# Patient Record
Sex: Female | Born: 1960 | Race: White | Hispanic: No | Smoking: Never smoker
Health system: Southern US, Community
[De-identification: ages and names within clinical notes are randomized; demographics above are authoritative.]

## PROBLEM LIST (undated history)

## (undated) DIAGNOSIS — Z973 Presence of spectacles and contact lenses: Secondary | ICD-10-CM

## (undated) DIAGNOSIS — E785 Hyperlipidemia, unspecified: Secondary | ICD-10-CM

## (undated) DIAGNOSIS — M84376A Stress fracture, unspecified foot, initial encounter for fracture: Secondary | ICD-10-CM

## (undated) DIAGNOSIS — Z87410 Personal history of cervical dysplasia: Secondary | ICD-10-CM

## (undated) DIAGNOSIS — I10 Essential (primary) hypertension: Secondary | ICD-10-CM

## (undated) DIAGNOSIS — E7800 Pure hypercholesterolemia, unspecified: Secondary | ICD-10-CM

## (undated) DIAGNOSIS — N871 Moderate cervical dysplasia: Secondary | ICD-10-CM

## (undated) DIAGNOSIS — M8580 Other specified disorders of bone density and structure, unspecified site: Secondary | ICD-10-CM

## (undated) HISTORY — PX: NO PAST SURGERIES: SHX2092

## (undated) HISTORY — PX: COLONOSCOPY: SHX174

---

## 1997-08-07 ENCOUNTER — Other Ambulatory Visit: Admission: RE | Admit: 1997-08-07 | Discharge: 1997-08-07 | Payer: Self-pay | Admitting: Obstetrics and Gynecology

## 1997-09-18 ENCOUNTER — Inpatient Hospital Stay (HOSPITAL_COMMUNITY): Admission: AD | Admit: 1997-09-18 | Discharge: 1997-09-20 | Payer: Self-pay | Admitting: Obstetrics & Gynecology

## 1997-09-21 ENCOUNTER — Encounter: Admission: RE | Admit: 1997-09-21 | Discharge: 1997-12-20 | Payer: Self-pay | Admitting: Obstetrics & Gynecology

## 1997-10-23 ENCOUNTER — Other Ambulatory Visit: Admission: RE | Admit: 1997-10-23 | Discharge: 1997-10-23 | Payer: Self-pay | Admitting: Obstetrics and Gynecology

## 1998-01-31 ENCOUNTER — Encounter (HOSPITAL_COMMUNITY): Admission: RE | Admit: 1998-01-31 | Discharge: 1998-05-01 | Payer: Self-pay | Admitting: *Deleted

## 1998-10-28 ENCOUNTER — Other Ambulatory Visit: Admission: RE | Admit: 1998-10-28 | Discharge: 1998-10-28 | Payer: Self-pay | Admitting: Obstetrics and Gynecology

## 1999-12-09 ENCOUNTER — Other Ambulatory Visit: Admission: RE | Admit: 1999-12-09 | Discharge: 1999-12-09 | Payer: Self-pay | Admitting: Obstetrics and Gynecology

## 2001-01-13 ENCOUNTER — Other Ambulatory Visit: Admission: RE | Admit: 2001-01-13 | Discharge: 2001-01-13 | Payer: Self-pay | Admitting: Obstetrics and Gynecology

## 2002-04-03 ENCOUNTER — Other Ambulatory Visit: Admission: RE | Admit: 2002-04-03 | Discharge: 2002-04-03 | Payer: Self-pay | Admitting: Obstetrics and Gynecology

## 2003-04-23 ENCOUNTER — Other Ambulatory Visit: Admission: RE | Admit: 2003-04-23 | Discharge: 2003-04-23 | Payer: Self-pay | Admitting: Obstetrics and Gynecology

## 2004-04-16 ENCOUNTER — Other Ambulatory Visit: Admission: RE | Admit: 2004-04-16 | Discharge: 2004-04-16 | Payer: Self-pay | Admitting: Obstetrics and Gynecology

## 2005-04-21 ENCOUNTER — Other Ambulatory Visit: Admission: RE | Admit: 2005-04-21 | Discharge: 2005-04-21 | Payer: Self-pay | Admitting: Obstetrics and Gynecology

## 2010-05-04 HISTORY — PX: HYSTEROSCOPY W/ ENDOMETRIAL ABLATION: SUR665

## 2016-10-28 ENCOUNTER — Other Ambulatory Visit: Payer: Self-pay | Admitting: Obstetrics and Gynecology

## 2016-10-28 DIAGNOSIS — R928 Other abnormal and inconclusive findings on diagnostic imaging of breast: Secondary | ICD-10-CM

## 2016-11-10 ENCOUNTER — Ambulatory Visit
Admission: RE | Admit: 2016-11-10 | Discharge: 2016-11-10 | Disposition: A | Discharge status: Home / Self Care | Payer: BLUE CROSS/BLUE SHIELD | Source: Ambulatory Visit | Attending: Obstetrics and Gynecology | Admitting: Obstetrics and Gynecology

## 2016-11-10 ENCOUNTER — Ambulatory Visit: Payer: Self-pay

## 2016-11-10 DIAGNOSIS — R928 Other abnormal and inconclusive findings on diagnostic imaging of breast: Secondary | ICD-10-CM

## 2018-04-09 IMAGING — MG 2D DIGITAL DIAGNOSTIC UNILATERAL LEFT MAMMOGRAM WITH CAD AND ADJ
9 series · 9 of 21 positions shown · non-contrast
Comparison: Previous exam(s).

CLINICAL DATA: The patient was called back for possible distortion
in the left breast.

EXAM:
2D DIGITAL DIAGNOSTIC UNILATERAL LEFT MAMMOGRAM WITH CAD AND ADJUNCT
TOMO

[L ML]
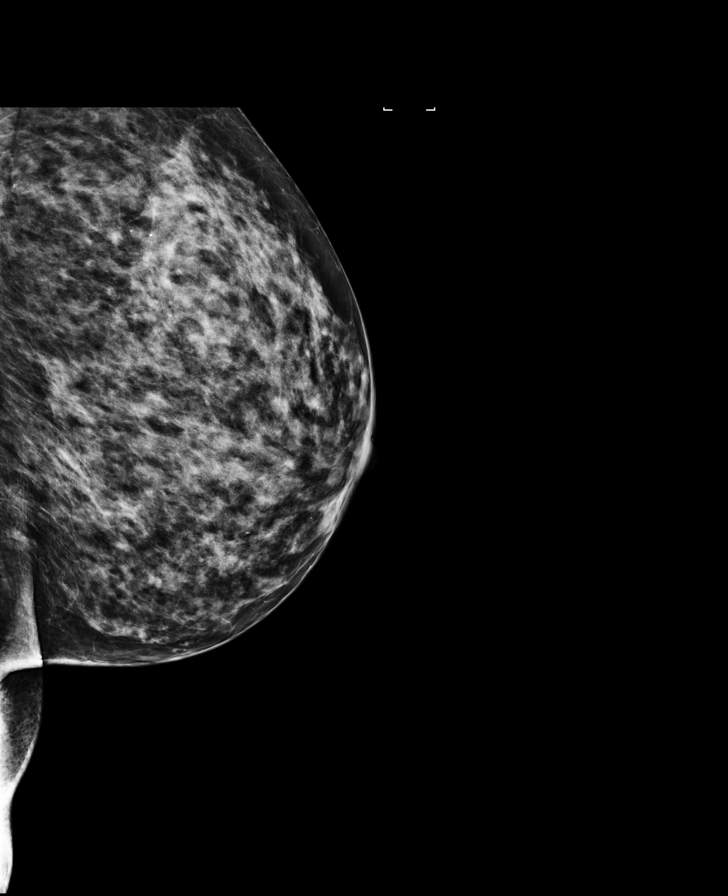

[L CC synth-2D (1 of 2)]
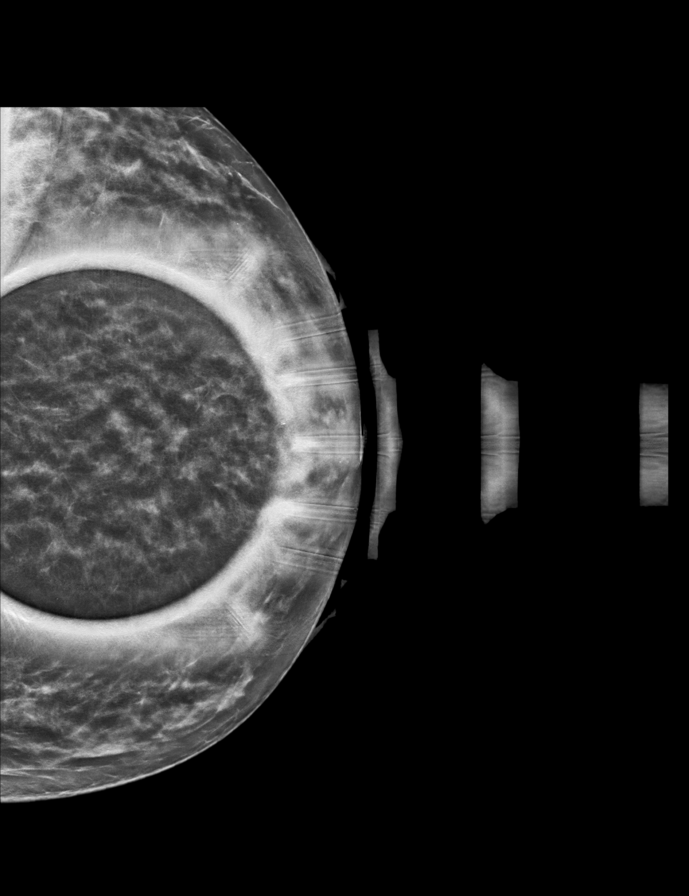

[L ML synth-2D]
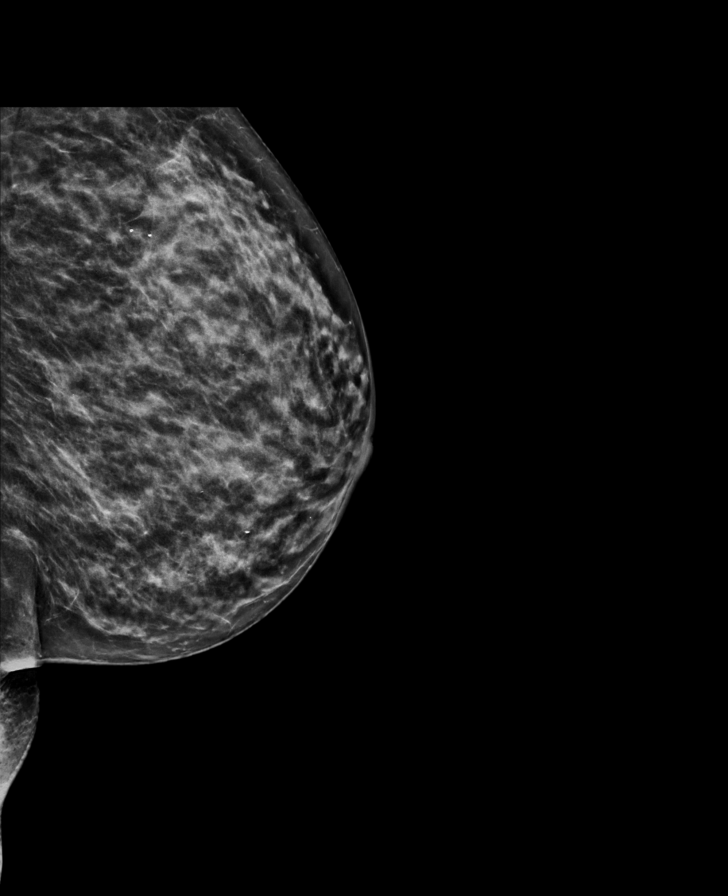

[L CC synth-2D (2 of 2)]
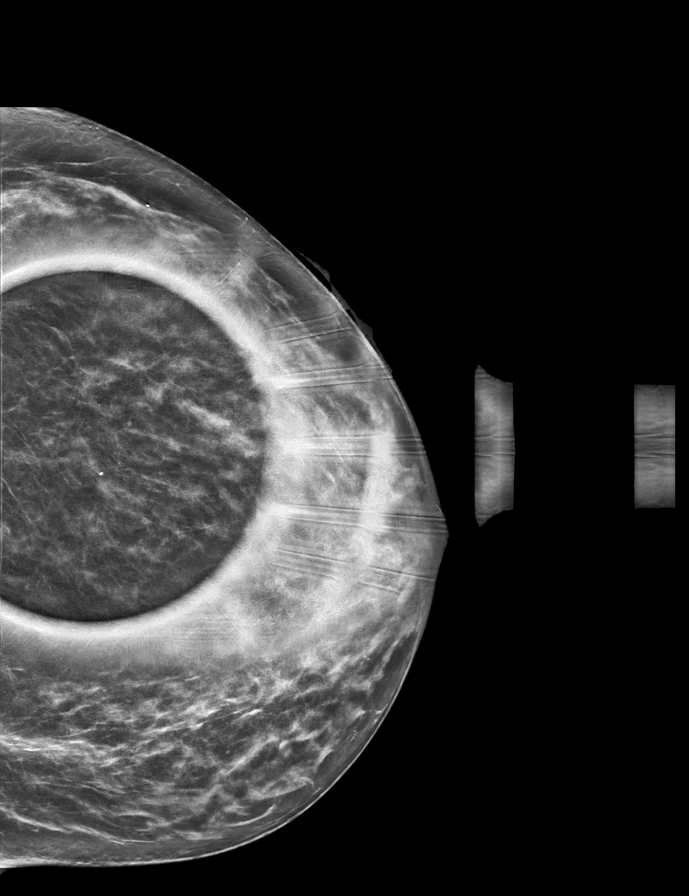

[L CC (1 of 2)]
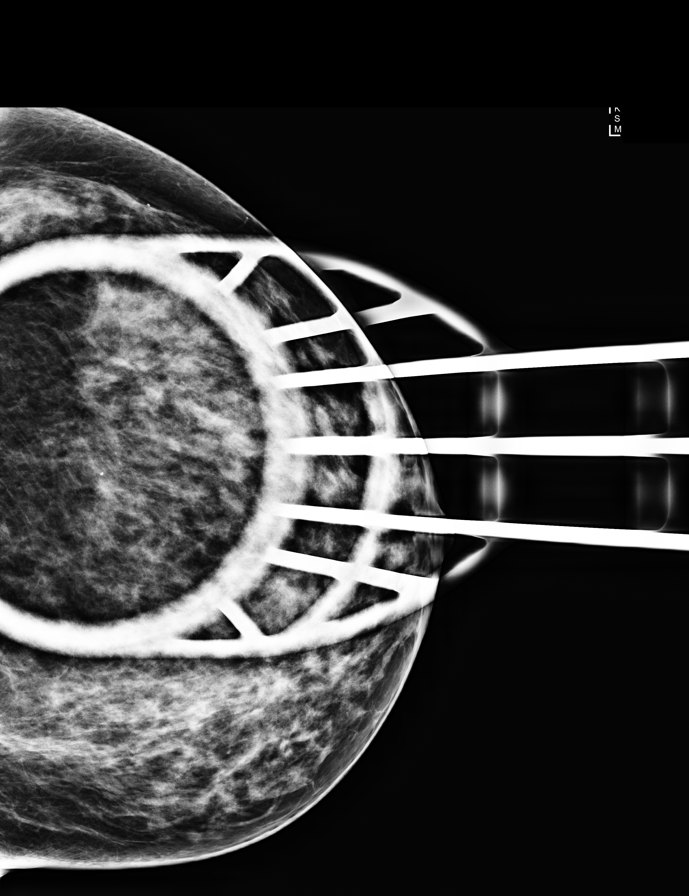

[L CC (2 of 2)]
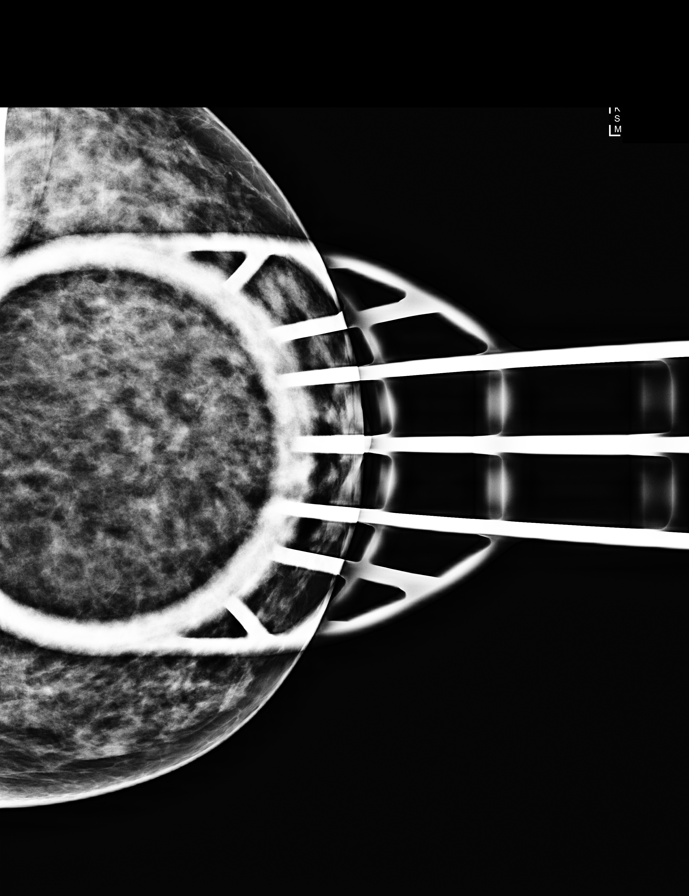

[L ML tomo · tomo slice 31/62.0]
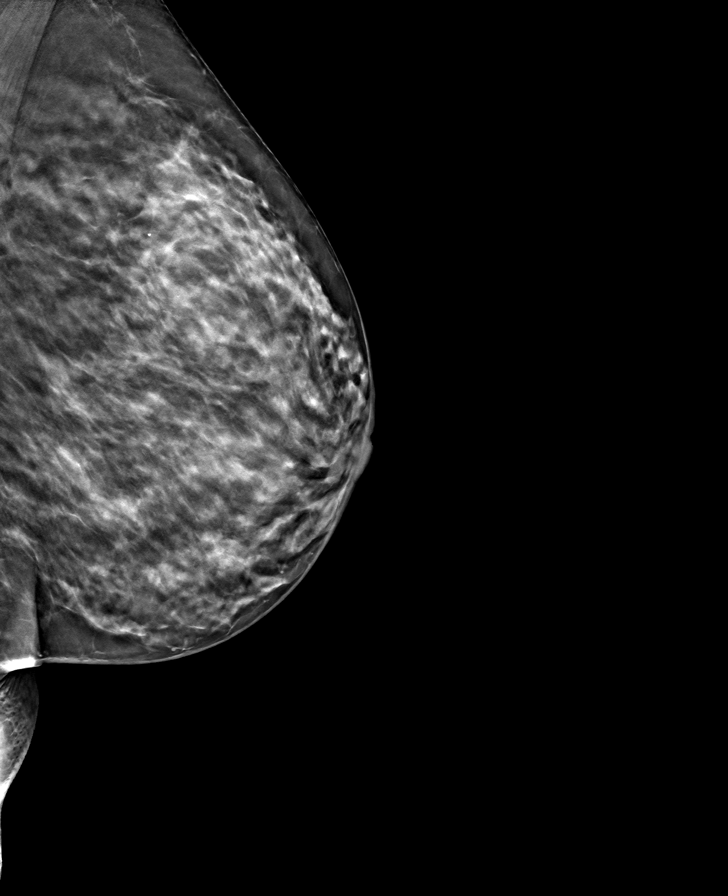

[L CC tomo (1 of 2) · tomo slice 29/58.0]
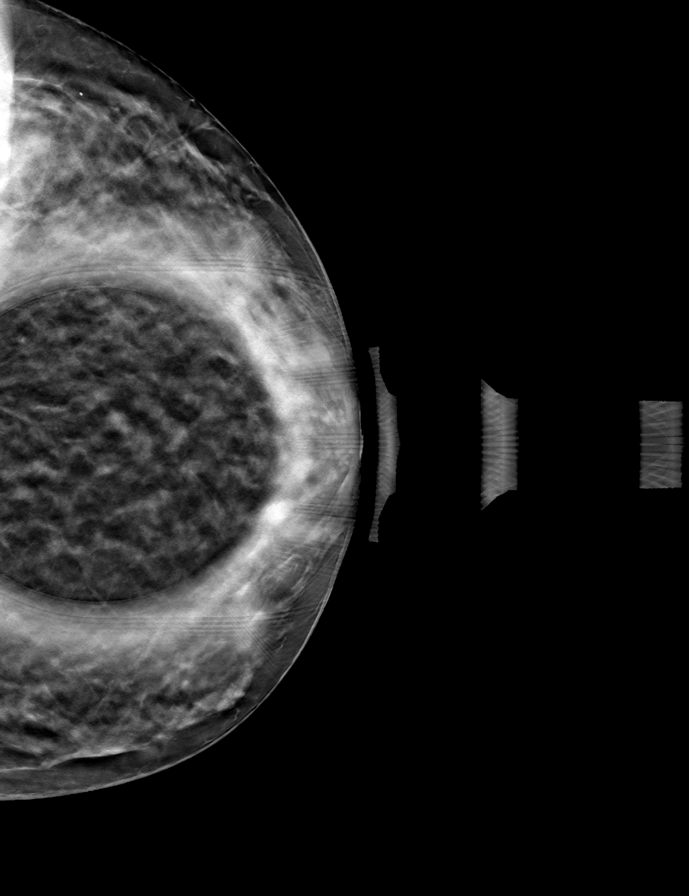

[L CC tomo (2 of 2) · tomo slice 30/59.0]
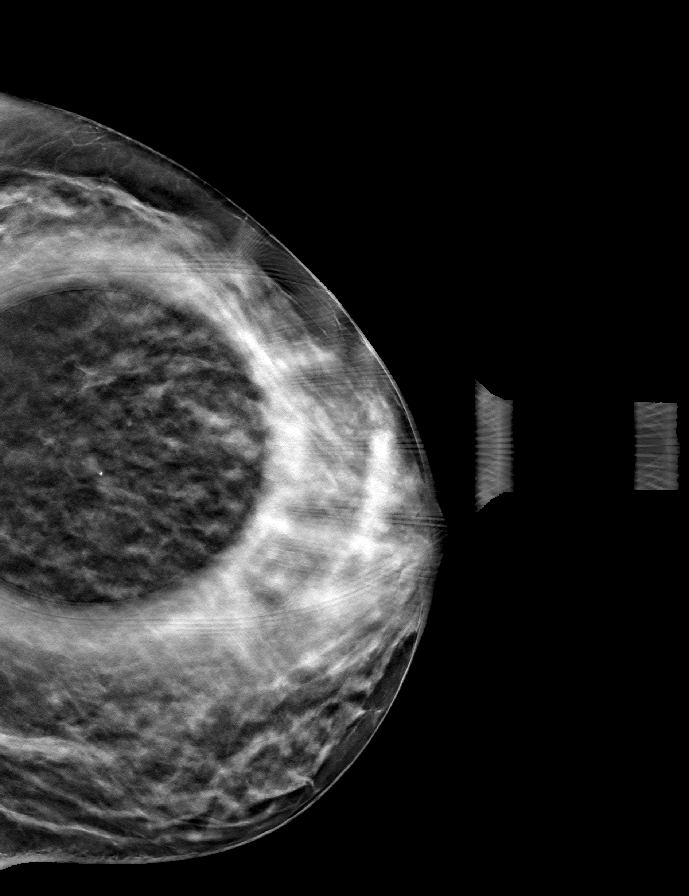

[9 of 21 positions shown; findings below may reference images not displayed]

ACR Breast Density Category c: The breast tissue is heterogeneously
dense, which may obscure small masses.
FINDINGS: The possible distortion resolves on additional imaging.

Mammographic images were processed with CAD.
IMPRESSION: No mammographic evidence of malignancy.

RECOMMENDATION:
Annual screening mammography.

I have discussed the findings and recommendations with the patient.
Results were also provided in writing at the conclusion of the
visit. If applicable, a reminder letter will be sent to the patient
regarding the next appointment.

BI-RADS CATEGORY  2: Benign.

## 2020-12-08 ENCOUNTER — Encounter (HOSPITAL_BASED_OUTPATIENT_CLINIC_OR_DEPARTMENT_OTHER): Payer: Self-pay | Admitting: Emergency Medicine

## 2020-12-08 ENCOUNTER — Other Ambulatory Visit: Payer: Self-pay

## 2020-12-08 ENCOUNTER — Emergency Department (HOSPITAL_BASED_OUTPATIENT_CLINIC_OR_DEPARTMENT_OTHER)
Admission: EM | Admit: 2020-12-08 | Discharge: 2020-12-08 | Disposition: A | Discharge status: Home / Self Care | Payer: BC Managed Care – PPO | Attending: Emergency Medicine | Admitting: Emergency Medicine

## 2020-12-08 ENCOUNTER — Emergency Department (HOSPITAL_BASED_OUTPATIENT_CLINIC_OR_DEPARTMENT_OTHER): Payer: BC Managed Care – PPO

## 2020-12-08 DIAGNOSIS — M79671 Pain in right foot: Secondary | ICD-10-CM | POA: Diagnosis not present

## 2020-12-08 DIAGNOSIS — W208XXA Other cause of strike by thrown, projected or falling object, initial encounter: Secondary | ICD-10-CM | POA: Insufficient documentation

## 2020-12-08 DIAGNOSIS — I10 Essential (primary) hypertension: Secondary | ICD-10-CM | POA: Diagnosis not present

## 2020-12-08 HISTORY — DX: Essential (primary) hypertension: I10

## 2020-12-08 HISTORY — DX: Pure hypercholesterolemia, unspecified: E78.00

## 2020-12-08 NOTE — ED Provider Notes (Signed)
[facility] EMERGENCY DEPARTMENT Provider Note   CSN: [id number] Arrival date & time: 12/08/20  1028     History Chief Complaint  Patient presents with   Foot Pain    Brittany Barr is a 60 y.o. female.  HPI  Patient presents with right foot pain x3 weeks.  Occurred gradually after moving boxes up and down stairs 2 weeks ago, improved with rest but started hurting again last night after playing tennis.  She felt like there is a throbbing dull pain over the top aspect of her foot.  Pain is intermittent, throbbing.  Worse with ambulation, has not tried any alleviating medications like Tylenol ibuprofen.  No trauma to the foot or injury that she can think of.  No previous surgeries to the foot, she is not having any numbness and tingling in her toes.  No color changes.  Past Medical History:  Diagnosis Date   High cholesterol    Hypertension     There are no problems to display for this patient.   Past Surgical History:  Procedure Laterality Date   ABLATION       OB History   No obstetric history on file.     No family history on file.  Social History   Tobacco Use   Smoking status: Never   Smokeless tobacco: Never  Vaping Use   Vaping Use: Never used  Substance Use Topics   Alcohol use: Yes    Alcohol/week: 1.0 standard drink    Types: 1 Glasses of wine per week   Drug use: Never    Home Medications Prior to Admission medications   Not on File    Allergies    Penicillins  Review of Systems   Review of Systems  Musculoskeletal:  Positive for arthralgias. Negative for gait problem.       Foot pain   Physical Exam Updated Vital Signs BP (!) 132/91 (BP Location: Right Arm)   Pulse 77   Temp 98.8 F (37.1 C) (Oral)   Resp 16   Ht 5\' 2"  (1.575 m)   Wt 58.1 kg   SpO2 98%   BMI 23.41 kg/m   Physical Exam Vitals and nursing note reviewed. Exam conducted with a chaperone present.  Constitutional:      General: She is not in acute  distress.    Appearance: Normal appearance.  HENT:     Head: Normocephalic and atraumatic.  Eyes:     General: No scleral icterus.    Extraocular Movements: Extraocular movements intact.     Pupils: Pupils are equal, round, and reactive to light.  Cardiovascular:     Comments: DP and PT are 2+ bilaterally Musculoskeletal:        General: Tenderness present. No swelling or deformity.     Comments: Range of motion is fully intact including flexion, extension, abduction and abduction.  There is no obvious swelling or skin color changes.  Cap refill is less than 2 in each toe.  Sensation is the same on both sides, she has some pain along the dorsal aspect above her third toe, but no bony tenderness or crepitus.  Skin:    Coloration: Skin is not jaundiced.  Neurological:     Mental Status: She is alert. Mental status is at baseline.     Coordination: Coordination normal.    ED Results / Procedures / Treatments   Labs (all labs ordered are listed, but only abnormal results are displayed) Labs Reviewed - No data to  display  EKG None  Radiology No results found.  Procedures Procedures   Medications Ordered in ED Medications - No data to display  ED Course  I have reviewed the triage vital signs and the nursing notes.  Pertinent labs & imaging results that were available during my care of the patient were reviewed by me and considered in my medical decision making (see chart for details).  Clinical Course as of 12/08/20 1209  Sun Dec 08, 2020  1207 No signs of fracture, since suspect musculoskeletal strain.  Advised patient to tell ibuprofen as needed, she voiced understanding.  Vitals are stable, she is ambulatory.  Not suspect any emergent pathology requiring further work-up at this time.  She is appropriate for discharge. [HS]    Clinical Course User Index [HS] Theron Arista, PA-C   MDM Rules/Calculators/A&P                           Patient vitals are stable, physical  exam unremarkable.  She is neurovascularly intact, no obvious trauma.  I suspect the pain is likely musculoskeletal inflammation from overuse, but will order an x-ray to make sure there is no fracture.    No fever, no systemic symptoms, do not suspect septic joint.  Please see ED course interpretation of work-up.  No signs of fracture, suspect musculoskeletal strain.  Patient appropriate for discharge at this time.  Final Clinical Impression(s) / ED Diagnoses Final diagnoses:  None    Rx / DC Orders ED Discharge Orders     None        Theron Arista, PA-C 12/08/20 1209    Rolan Bucco, MD 12/08/20 [phone]

## 2020-12-08 NOTE — ED Triage Notes (Signed)
Pt c/o right foot pain ongoing for 3 weeks when she was moving furniture with slides on. Pt reports symptoms improved until she played tennis yesterday and she stepped wrong last night.

## 2020-12-08 NOTE — Discharge Instructions (Addendum)
May take ibuprofen or Tylenol as needed for pain.  Suspect your pain is likely due to inflammation from overuse, this will improve on its own with time.  I suspect will probably be a few weeks before you feel back to normal, you do not need to abstain from normal activities but I would avoid exerting yourself significantly.  Follow-up with your primary care doctor in the next 2 weeks if there is not any improvement.  Please turn to the ED if things change or worsen.

## 2021-01-21 ENCOUNTER — Encounter (HOSPITAL_BASED_OUTPATIENT_CLINIC_OR_DEPARTMENT_OTHER): Payer: Self-pay | Admitting: Obstetrics and Gynecology

## 2021-01-21 ENCOUNTER — Other Ambulatory Visit: Payer: Self-pay

## 2021-01-21 NOTE — Progress Notes (Signed)
Spoke w/ via phone for pre-op interview--- Pt Lab needs dos----  no             Lab results------ pt getting lab work done 01-24-2021 CBCdiff, T&S, BMP, EKG COVID test -----patient states asymptomatic no test needed Arrive at ------- 1315 on 01-28-2021 NPO after MN NO Solid Food.  Clear liquids from MN until--- 1215 Med rec completed Medications to take morning of surgery -----none Diabetic medication ----- n/a Patient instructed no nail polish to be worn day of surgery Patient instructed to bring photo id and insurance card day of surgery Patient aware to have Driver (ride ) / caregiver for 24 hours after surgery --friend, steve riley Patient Special Instructions ----- n/a Pre-Op special Istructions ----- n/a Patient verbalized understanding of instructions that were given at this phone interview. Patient denies shortness of breath, chest pain, fever, cough at this phone interview.

## 2021-01-24 ENCOUNTER — Other Ambulatory Visit: Payer: Self-pay

## 2021-01-24 ENCOUNTER — Encounter (HOSPITAL_COMMUNITY)
Admission: RE | Admit: 2021-01-24 | Discharge: 2021-01-24 | Disposition: A | Discharge status: Home / Self Care | Payer: BC Managed Care – PPO | Source: Ambulatory Visit | Attending: Obstetrics and Gynecology | Admitting: Obstetrics and Gynecology

## 2021-01-24 DIAGNOSIS — Z01812 Encounter for preprocedural laboratory examination: Secondary | ICD-10-CM | POA: Diagnosis present

## 2021-01-24 LAB — CBC WITH DIFFERENTIAL/PLATELET
Abs Immature Granulocytes: 0.02 10*3/uL (ref 0.00–0.07)
Basophils Absolute: 0.1 10*3/uL (ref 0.0–0.1)
Basophils Relative: 2 %
Eosinophils Absolute: 0.1 10*3/uL (ref 0.0–0.5)
Eosinophils Relative: 3 %
HCT: 41.2 % (ref 36.0–46.0)
Hemoglobin: 13.7 g/dL (ref 12.0–15.0)
Immature Granulocytes: 0 %
Lymphocytes Relative: 33 %
Lymphs Abs: 1.5 10*3/uL (ref 0.7–4.0)
MCH: 34.4 pg — ABNORMAL HIGH (ref 26.0–34.0)
MCHC: 33.3 g/dL (ref 30.0–36.0)
MCV: 103.5 fL — ABNORMAL HIGH (ref 80.0–100.0)
Monocytes Absolute: 0.5 10*3/uL (ref 0.1–1.0)
Monocytes Relative: 10 %
Neutro Abs: 2.5 10*3/uL (ref 1.7–7.7)
Neutrophils Relative %: 52 %
Platelets: 281 10*3/uL (ref 150–400)
RBC: 3.98 MIL/uL (ref 3.87–5.11)
RDW: 12.2 % (ref 11.5–15.5)
WBC: 4.7 10*3/uL (ref 4.0–10.5)
nRBC: 0 % (ref 0.0–0.2)

## 2021-01-24 LAB — BASIC METABOLIC PANEL
Anion gap: 9 (ref 5–15)
BUN: 11 mg/dL (ref 6–20)
CO2: 28 mmol/L (ref 22–32)
Calcium: 10.2 mg/dL (ref 8.9–10.3)
Chloride: 109 mmol/L (ref 98–111)
Creatinine, Ser: 0.61 mg/dL (ref 0.44–1.00)
GFR, Estimated: >60 mL/min (ref >60)
Glucose, Bld: 96 mg/dL (ref 70–99)
Potassium: 4.2 mmol/L (ref 3.5–5.1)
Sodium: 146 mmol/L — ABNORMAL HIGH (ref 135–145)

## 2021-01-27 NOTE — H&P (Signed)
Brittany Barr is an 60 y.o. 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   08/29/2012 pap NILM 09/21/2013 pap NILM 10/04/2014 pap NILM 10/24/2015 pap LSIL 11/12/2015 endocervix curettage at least CIN 2 12/05/2015 LEEP CIN 1, negative margins 10/27/2017 pap WNL 11/29/2017 pap LSIL 12/20/2017 colpo benign 03/28/2018 pap LSIL 03/22/2019 pap NILM 05/22/2020 pap LSIL 11/19/20 ASCUS HPV neg 12/10/20 colpo CIN 2 (6, 9, and 10 o'clock; ECC CIN 1)   No LMP recorded. Patient has had an ablation.    Past Medical History:  Diagnosis Date   CIN II (cervical intraepithelial neoplasia II)    Dyslipidemia    History of cervical dysplasia    Hypertension    followed by pcp and cardiologist-- dr t. Judithe Modest   Osteopenia    Stress fracture of foot    right foot, wearing boot   Wears contact lenses     Past Surgical History:  Procedure Laterality Date   COLONOSCOPY     last one 2017   HYSTEROSCOPY W/ ENDOMETRIAL ABLATION  2012   in gyn office   NO PAST SURGERIES      History reviewed. No pertinent family history.  Social History:  reports that she has never smoked. She has never used smokeless tobacco. She reports current alcohol use of about 7.0 standard drinks per week. She reports that she does not use drugs.  Allergies:  Allergies  Allergen Reactions   Thimerosal Swelling    "Makes eyes bubble"   Penicillins Rash    No medications prior to admission.    Review of Systems  Constitutional:  Negative for fever.  HENT:  Negative for sore throat.   Eyes:  Negative for pain.  Respiratory:  Negative for shortness of breath.   Cardiovascular:  Negative for chest pain.  Gastrointestinal:  Negative for abdominal pain.  Genitourinary:  Negative for pelvic pain and vaginal bleeding.  Musculoskeletal:  Negative for myalgias.  Skin:  Negative for rash.  Neurological:  Negative for headaches.  Psychiatric/Behavioral:  Negative for suicidal ideas.    Height 5\' 2"  (1.575  m), weight 57.6 kg. Physical Exam From 12/10/20 office exam  Chaperone Chaperone: present  Constitutional General Appearance: healthy-appearing, well-nourished, well-developed  Psychiatric Orientation: to time, to place, to person Mood and Affect: active and alert, normal mood, normal affect  Female Genitalia Vulva: no masses, no atrophy, no lesions Bladder/Urethra: normal meatus, no urethral discharge, no urethral mass, bladder non distended Vagina no tenderness, no erythema, no abnormal vaginal discharge, no vesicle(s) or ulcers, no cystocele, no rectocele (atrophy) Cervix: no discharge, no cervical motion tenderness (stenotic cervix)  No results found for this or any previous visit (from the past 24 hour(s)).  No results found.  Assessment/Plan: 59Y with CIN2 on colposcopy, plan for cold knife conization of cervix.   02/09/21 01/27/2021, 7:46 PM

## 2021-01-28 ENCOUNTER — Encounter (HOSPITAL_BASED_OUTPATIENT_CLINIC_OR_DEPARTMENT_OTHER)
Admission: RE | Disposition: A | Discharge status: Home / Self Care | Payer: Self-pay | Source: Ambulatory Visit | Attending: Obstetrics and Gynecology

## 2021-01-28 ENCOUNTER — Ambulatory Visit (HOSPITAL_BASED_OUTPATIENT_CLINIC_OR_DEPARTMENT_OTHER)
Admission: RE | Admit: 2021-01-28 | Discharge: 2021-01-28 | Disposition: A | Discharge status: Home / Self Care | Payer: BC Managed Care – PPO | Source: Ambulatory Visit | Attending: Obstetrics and Gynecology | Admitting: Obstetrics and Gynecology

## 2021-01-28 ENCOUNTER — Ambulatory Visit (HOSPITAL_BASED_OUTPATIENT_CLINIC_OR_DEPARTMENT_OTHER): Payer: BC Managed Care – PPO | Admitting: Certified Registered"

## 2021-01-28 ENCOUNTER — Encounter (HOSPITAL_BASED_OUTPATIENT_CLINIC_OR_DEPARTMENT_OTHER): Payer: Self-pay | Admitting: Obstetrics and Gynecology

## 2021-01-28 DIAGNOSIS — N871 Moderate cervical dysplasia: Secondary | ICD-10-CM | POA: Insufficient documentation

## 2021-01-28 DIAGNOSIS — I10 Essential (primary) hypertension: Secondary | ICD-10-CM | POA: Diagnosis not present

## 2021-01-28 DIAGNOSIS — M8580 Other specified disorders of bone density and structure, unspecified site: Secondary | ICD-10-CM | POA: Insufficient documentation

## 2021-01-28 HISTORY — DX: Personal history of cervical dysplasia: Z87.410

## 2021-01-28 HISTORY — PX: CERVICAL CONIZATION W/BX: SHX1330

## 2021-01-28 HISTORY — DX: Presence of spectacles and contact lenses: Z97.3

## 2021-01-28 HISTORY — DX: Stress fracture, unspecified foot, initial encounter for fracture: M84.376A

## 2021-01-28 HISTORY — DX: Other specified disorders of bone density and structure, unspecified site: M85.80

## 2021-01-28 HISTORY — DX: Hyperlipidemia, unspecified: E78.5

## 2021-01-28 HISTORY — DX: Moderate cervical dysplasia: N87.1

## 2021-01-28 LAB — TYPE AND SCREEN
ABO/RH(D): O POS
Antibody Screen: NEGATIVE

## 2021-01-28 LAB — ABO/RH: ABO/RH(D): O POS

## 2021-01-28 SURGERY — CONE BIOPSY, CERVIX
Anesthesia: General | Site: Cervix

## 2021-01-28 MED ORDER — ONDANSETRON HCL 4 MG/2ML IJ SOLN
INTRAMUSCULAR | Status: AC
  Filled 2021-01-28: qty 2

## 2021-01-28 MED ORDER — MIDAZOLAM HCL 2 MG/2ML IJ SOLN
INTRAMUSCULAR | Status: AC
  Filled 2021-01-28: qty 2

## 2021-01-28 MED ORDER — ACETAMINOPHEN 500 MG PO TABS
ORAL_TABLET | ORAL | Status: AC
  Filled 2021-01-28: qty 2

## 2021-01-28 MED ORDER — LIDOCAINE HCL (PF) 2 % IJ SOLN
INTRAMUSCULAR | Status: AC
  Filled 2021-01-28: qty 5

## 2021-01-28 MED ORDER — MIDAZOLAM HCL 2 MG/2ML IJ SOLN
INTRAMUSCULAR | Status: DC | PRN
  Administered 2021-01-28 (×2): 1 mg via INTRAVENOUS

## 2021-01-28 MED ORDER — LIDOCAINE 2% (20 MG/ML) 5 ML SYRINGE
INTRAMUSCULAR | Status: DC | PRN
  Administered 2021-01-28: 40 mg via INTRAVENOUS

## 2021-01-28 MED ORDER — IBUPROFEN 200 MG PO TABS: 600.0000 mg | ORAL_TABLET | Freq: Four times a day (QID) | ORAL | Status: AC | PRN

## 2021-01-28 MED ORDER — OXYCODONE HCL 5 MG/5ML PO SOLN
5.0000 mg | Freq: Once | ORAL | Status: DC | PRN
Start: 2021-01-28 — End: 2021-01-28

## 2021-01-28 MED ORDER — PROPOFOL 10 MG/ML IV BOLUS
INTRAVENOUS | Status: AC
  Filled 2021-01-28: qty 20

## 2021-01-28 MED ORDER — IODINE STRONG (LUGOLS) 5 % PO SOLN
ORAL | Status: DC | PRN
  Administered 2021-01-28: 0.1 mL

## 2021-01-28 MED ORDER — KETOROLAC TROMETHAMINE 30 MG/ML IJ SOLN
INTRAMUSCULAR | Status: AC
  Filled 2021-01-28: qty 1

## 2021-01-28 MED ORDER — PROPOFOL 10 MG/ML IV BOLUS
INTRAVENOUS | Status: DC | PRN
  Administered 2021-01-28: 150 mg via INTRAVENOUS

## 2021-01-28 MED ORDER — FENTANYL CITRATE (PF) 100 MCG/2ML IJ SOLN
INTRAMUSCULAR | Status: AC
  Filled 2021-01-28: qty 2

## 2021-01-28 MED ORDER — 0.9 % SODIUM CHLORIDE (POUR BTL) OPTIME
TOPICAL | Status: DC | PRN
  Administered 2021-01-28: 500 mL

## 2021-01-28 MED ORDER — DEXAMETHASONE SODIUM PHOSPHATE 10 MG/ML IJ SOLN
INTRAMUSCULAR | Status: DC | PRN
  Administered 2021-01-28 (×2): 5 mg via INTRAVENOUS

## 2021-01-28 MED ORDER — OXYCODONE HCL 5 MG PO TABS: 5.0000 mg | ORAL_TABLET | Freq: Once | ORAL | Status: DC | PRN

## 2021-01-28 MED ORDER — DEXAMETHASONE SODIUM PHOSPHATE 10 MG/ML IJ SOLN
INTRAMUSCULAR | Status: AC
  Filled 2021-01-28: qty 1

## 2021-01-28 MED ORDER — ACETAMINOPHEN 500 MG PO TABS
1000.0000 mg | ORAL_TABLET | ORAL | Status: AC
  Administered 2021-01-28: 1000 mg via ORAL

## 2021-01-28 MED ORDER — KETOROLAC TROMETHAMINE 30 MG/ML IJ SOLN
INTRAMUSCULAR | Status: DC | PRN
  Administered 2021-01-28: 30 mg via INTRAVENOUS

## 2021-01-28 MED ORDER — LIDOCAINE-EPINEPHRINE 1 %-1:100000 IJ SOLN
INTRAMUSCULAR | Status: DC | PRN
  Administered 2021-01-28: 10 mL

## 2021-01-28 MED ORDER — KETOROLAC TROMETHAMINE 15 MG/ML IJ SOLN: 15.0000 mg | INTRAMUSCULAR | Status: DC

## 2021-01-28 MED ORDER — PROMETHAZINE HCL 25 MG/ML IJ SOLN: 6.2500 mg | INTRAMUSCULAR | Status: DC | PRN

## 2021-01-28 MED ORDER — ONDANSETRON HCL 4 MG/2ML IJ SOLN
INTRAMUSCULAR | Status: DC | PRN
  Administered 2021-01-28: 4 mg via INTRAVENOUS

## 2021-01-28 MED ORDER — FENTANYL CITRATE (PF) 100 MCG/2ML IJ SOLN: 25.0000 ug | INTRAMUSCULAR | Status: DC | PRN

## 2021-01-28 MED ORDER — FENTANYL CITRATE (PF) 100 MCG/2ML IJ SOLN
INTRAMUSCULAR | Status: DC | PRN
  Administered 2021-01-28: 50 ug via INTRAVENOUS
  Administered 2021-01-28 (×2): 25 ug via INTRAVENOUS

## 2021-01-28 MED ORDER — LACTATED RINGERS IV SOLN: INTRAVENOUS | Status: DC

## 2021-01-28 SURGICAL SUPPLY — 21 items
BLADE SURG 11 STRL SS (BLADE) ×2 IMPLANT
BLADE SURG 15 STRL LF DISP TIS (BLADE) ×1 IMPLANT
BLADE SURG 15 STRL SS (BLADE) ×2
ELECT BALL LEEP 5MM RED (ELECTRODE) ×2 IMPLANT
GAUZE 4X4 16PLY ~~LOC~~+RFID DBL (SPONGE) ×4 IMPLANT
GLOVE SURG ENC MOIS LTX SZ6 (GLOVE) ×2 IMPLANT
GLOVE SURG POLYISO LF SZ6 (GLOVE) ×2 IMPLANT
GLOVE SURG UNDER POLY LF SZ6.5 (GLOVE) ×2 IMPLANT
GOWN STRL REUS W/TWL LRG LVL3 (GOWN DISPOSABLE) ×2 IMPLANT
HIBICLENS CHG 4% 4OZ (MISCELLANEOUS) ×2 IMPLANT
KIT TURNOVER CYSTO (KITS) ×2 IMPLANT
NS IRRIG 1000ML POUR BTL (IV SOLUTION) ×2 IMPLANT
PACK VAGINAL WOMENS (CUSTOM PROCEDURE TRAY) ×2 IMPLANT
PAD OB MATERNITY 4.3X12.25 (PERSONAL CARE ITEMS) ×2 IMPLANT
PAD PREP 24X48 CUFFED NSTRL (MISCELLANEOUS) ×2 IMPLANT
SCOPETTES 8  STERILE (MISCELLANEOUS) ×2
SCOPETTES 8 STERILE (MISCELLANEOUS) ×1 IMPLANT
SUT SILK 2 0 PERMA HAND 18 BK (SUTURE) ×2 IMPLANT
SUT VIC AB 0 CT1 27 (SUTURE) ×4
SUT VIC AB 0 CT1 27XBRD ANTBC (SUTURE) ×2 IMPLANT
TOWEL OR 17X26 10 PK STRL BLUE (TOWEL DISPOSABLE) ×2 IMPLANT

## 2021-01-28 NOTE — Anesthesia Postprocedure Evaluation (Signed)
Anesthesia Post Note  Patient: Brittany Barr  Procedure(s) Performed: CONIZATION CERVIX WITH BIOPSY (Cervix)     Patient location during evaluation: PACU Anesthesia Type: General Level of consciousness: awake and alert Pain management: pain level controlled Vital Signs Assessment: post-procedure vital signs reviewed and stable Respiratory status: spontaneous breathing, nonlabored ventilation and respiratory function stable Cardiovascular status: stable and blood pressure returned to baseline Anesthetic complications: no   No notable events documented.  Last Vitals:  Vitals:   01/28/21 1530 01/28/21 1547  BP: 128/81 140/82  Pulse: 80 79  Resp: 15 16  Temp:  37.3 C  SpO2: 98% 98%    Last Pain:  Vitals:   01/28/21 1547  TempSrc:   PainSc: 0-No pain                 Beryle Lathe

## 2021-01-28 NOTE — Discharge Instructions (Addendum)
  Post Anesthesia Home Care Instructions  Activity: Get plenty of rest for the remainder of the day. A responsible individual must stay with you for 24 hours following the procedure.  For the next 24 hours, DO NOT: -Drive a car -Advertising copywriter -Drink alcoholic beverages -Take any medication unless instructed by your physician -Make any legal decisions or sign important papers.  Meals: Start with liquid foods such as gelatin or soup. Progress to regular foods as tolerated. Avoid greasy, spicy, heavy foods. If nausea and/or vomiting occur, drink only clear liquids until the nausea and/or vomiting subsides. Call your physician if vomiting continues.  Special Instructions/Symptoms: Your throat may feel dry or sore from the anesthesia or the breathing tube placed in your throat during surgery. If this causes discomfort, gargle with warm salt water. The discomfort should disappear within 24 hours.  **No acetaminophen/Tylenol until after 7:30pm today if needed.     **No ibuprofen, Advil, Aleve, Motrin, ketorolac, meloxicam, or naproxen until after 9:00pm today if needed.

## 2021-01-28 NOTE — Interval H&P Note (Signed)
History and Physical Interval Note:  01/28/2021 2:20 PM  Brittany Barr  has presented today for surgery, with the diagnosis of Cervical intraepithelial neoplasia grade 2.  The various methods of treatment have been discussed with the patient and family. After consideration of risks, benefits and other options for treatment, the patient has consented to  Procedure(s) with comments: CONIZATION CERVIX WITH BIOPSY (N/A) - cold knife cone as a surgical intervention.  The patient's history has been reviewed, patient examined, no change in status, stable for surgery.  I have reviewed the patient's chart and labs.  Questions were answered to the patient's satisfaction.     Charlett Nose

## 2021-01-28 NOTE — Transfer of Care (Signed)
Immediate Anesthesia Transfer of Care Note  Patient: Brittany Barr  Procedure(s) Performed: Procedure(s) (LRB): CONIZATION CERVIX WITH BIOPSY (N/A)  Patient Location: PACU  Anesthesia Type: General  Level of Consciousness: awake, oriented, sedated and patient cooperative  Airway & Oxygen Therapy: Patient Spontanous Breathing and Patient connected to face mask oxygen  Post-op Assessment: Report given to PACU RN and Post -op Vital signs reviewed and stable  Post vital signs: Reviewed and stable  Complications: No apparent anesthesia complications Last Vitals:  Vitals Value Taken Time  BP 130/78 01/28/21 1512  Temp    Pulse 83 01/28/21 1514  Resp 16 01/28/21 1514  SpO2 98 % 01/28/21 1514  Vitals shown include unvalidated device data.  Last Pain:  Vitals:   01/28/21 1330  TempSrc: Oral  PainSc: 0-No pain      Patients Stated Pain Goal: 5 (01/28/21 1330)  Complications: No notable events documented.

## 2021-01-28 NOTE — Anesthesia Procedure Notes (Signed)
Procedure Name: LMA Insertion Date/Time: 01/28/2021 2:34 PM Performed by: Francie Massing, CRNA Pre-anesthesia Checklist: Patient identified, Emergency Drugs available, Suction available and Patient being monitored Patient Re-evaluated:Patient Re-evaluated prior to induction Oxygen Delivery Method: Circle system utilized Preoxygenation: Pre-oxygenation with 100% oxygen Induction Type: IV induction Ventilation: Mask ventilation without difficulty LMA: LMA inserted LMA Size: 4.0 Number of attempts: 1 Airway Equipment and Method: Bite block Placement Confirmation: positive ETCO2 Tube secured with: Tape Dental Injury: Teeth and Oropharynx as per pre-operative assessment

## 2021-01-28 NOTE — Op Note (Signed)
Operative Note  PATIENT:  Brittany Barr  60 y.o. female   PRE-OPERATIVE DIAGNOSIS:  Cervical intraepithelial neoplasia grade 2   POST-OPERATIVE DIAGNOSIS:  Cervical intraepithelial neoplasia grade 2   PROCEDURE:  Procedure(s) with comments: CONIZATION CERVIX WITH BIOPSY (N/A) - cold knife cone   SURGEON:  Surgeon(s) and Role:    * Charlett Nose, MD - Primary     ANESTHESIA:   general   EBL:  10 mL    BLOOD ADMINISTERED:none   DRAINS: none    LOCAL MEDICATIONS USED:  LIDOCAINE  and Amount: 10 ml   SPECIMEN:  Source of Specimen:  cervical cone biopsy, marking stitch at 12 o'clock   DISPOSITION OF SPECIMEN:  PATHOLOGY   COUNTS:  YES   PLAN OF CARE: Discharge to home after PACU   PATIENT DISPOSITION:  PACU - hemodynamically stable.  FINDINGS: Small cervix with pinpoint external cervical os. Transformation zone partially visualized anteriorly. Decreased Lugol's uptake at 11 and 6 o'clock. No decreased uptake on vaginal walls.   PROCEDURE:  The patient was appropriately consented and taken to the operating room where anesthesia was administered without difficulty. She was placed in the lithotomy position. She was prepped and draped in normal sterile fashion. A speculum was inserted and small cervix with pinpoint external cervical os was noted. Transformation zone was partially visualized anteriorly. Lugol's solution was applied to the cervix and upper vagina. Decreased uptake was noted at 11 and 6 o'clock. No vaginal changes were noted. A tenaculum was placed on the cervix for traction. 54mL of 1% lidocaine with epinephrine were injected around the cervix. Stitches of 0 Vicryl sutures were placed at 3 o'clock and 9 o'clock in the lateral vagina fornices and tagged for traction. A scalpel was used to excise a cone shaped biopsy circumferentially around the cervical os. The specimen was removed intact and a stitch was placed to mark the 12 o'clock position. Endocervical  curettings were not able to be obtained due to stenosis of cervix. The excision site was coagulated with cautery. Stay sutures were trimmed. Excellent hemostasis was noted.  All counts were correct times two. The patient was awakened from anesthesia and taken to the recovery in stable condition.  Derl Barrow, MD 01/28/21 3:18 PM

## 2021-01-28 NOTE — Brief Op Note (Signed)
01/28/2021  3:08 PM  PATIENT:  Brittany Barr  60 y.o. female  PRE-OPERATIVE DIAGNOSIS:  Cervical intraepithelial neoplasia grade 2  POST-OPERATIVE DIAGNOSIS:  Cervical intraepithelial neoplasia grade 2  PROCEDURE:  Procedure(s) with comments: CONIZATION CERVIX WITH BIOPSY (N/A) - cold knife cone  SURGEON:  Surgeon(s) and Role:    * Charlett Nose, MD - Primary   ANESTHESIA:   general  EBL:  10 mL   BLOOD ADMINISTERED:none  DRAINS: none   LOCAL MEDICATIONS USED:  LIDOCAINE  and Amount: 10 ml  SPECIMEN:  Source of Specimen:  cervical cone biopsy, marking stitch at 12 o'clock  DISPOSITION OF SPECIMEN:  PATHOLOGY  COUNTS:  YES  TOURNIQUET:  * No tourniquets in log *  DICTATION: .Note written in EPIC  PLAN OF CARE: Discharge to home after PACU  PATIENT DISPOSITION:  PACU - hemodynamically stable.   Delay start of Pharmacological VTE agent (>24hrs) due to surgical blood loss or risk of bleeding: not applicable

## 2021-01-28 NOTE — Anesthesia Preprocedure Evaluation (Addendum)
Anesthesia Evaluation  Patient identified by MRN, date of birth, ID band Patient awake    Reviewed: Allergy & Precautions, NPO status , Patient's Chart, lab work & pertinent test results  History of Anesthesia Complications Negative for: history of anesthetic complications  Airway Mallampati: II  TM Distance: >3 FB Neck ROM: Full    Dental  (+) Dental Advisory Given, Teeth Intact   Pulmonary neg pulmonary ROS,    Pulmonary exam normal        Cardiovascular hypertension, Pt. on medications Normal cardiovascular exam     Neuro/Psych negative neurological ROS  negative psych ROS   GI/Hepatic negative GI ROS, Neg liver ROS,   Endo/Other   Na 146   Renal/GU negative Renal ROS     Musculoskeletal negative musculoskeletal ROS (+)   Abdominal   Peds  Hematology negative hematology ROS (+)   Anesthesia Other Findings   Reproductive/Obstetrics                            Anesthesia Physical Anesthesia Plan  ASA: 2  Anesthesia Plan: General   Post-op Pain Management:    Induction: Intravenous  PONV Risk Score and Plan: 3 and Treatment may vary due to age or medical condition, Ondansetron, Dexamethasone and Midazolam  Airway Management Planned: LMA  Additional Equipment: None  Intra-op Plan:   Post-operative Plan: Extubation in OR  Informed Consent: I have reviewed the patients History and Physical, chart, labs and discussed the procedure including the risks, benefits and alternatives for the proposed anesthesia with the patient or authorized representative who has indicated his/her understanding and acceptance.     Dental advisory given  Plan Discussed with: CRNA and Anesthesiologist  Anesthesia Plan Comments:        Anesthesia Quick Evaluation

## 2021-01-29 ENCOUNTER — Encounter (HOSPITAL_BASED_OUTPATIENT_CLINIC_OR_DEPARTMENT_OTHER): Payer: Self-pay | Admitting: Obstetrics and Gynecology

## 2021-01-30 LAB — SURGICAL PATHOLOGY

## 2022-05-07 IMAGING — DX DG FOOT COMPLETE 3+V*R*
3 series · 3 of 3 positions shown · non-contrast
Comparison: None.

CLINICAL DATA: Foot pain.

EXAM:
RIGHT FOOT COMPLETE - 3+ VIEW

[foot ap]
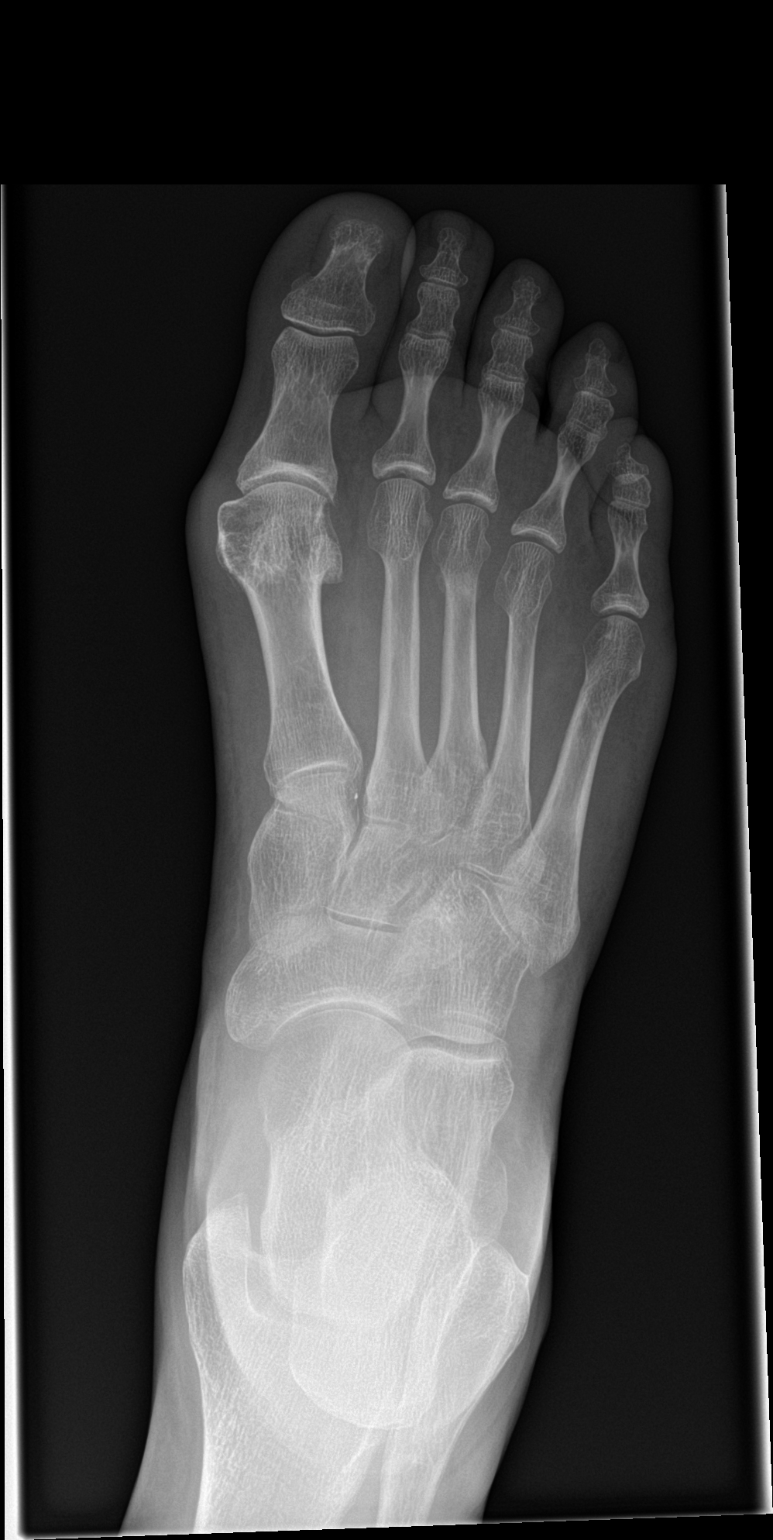

[foot obl]
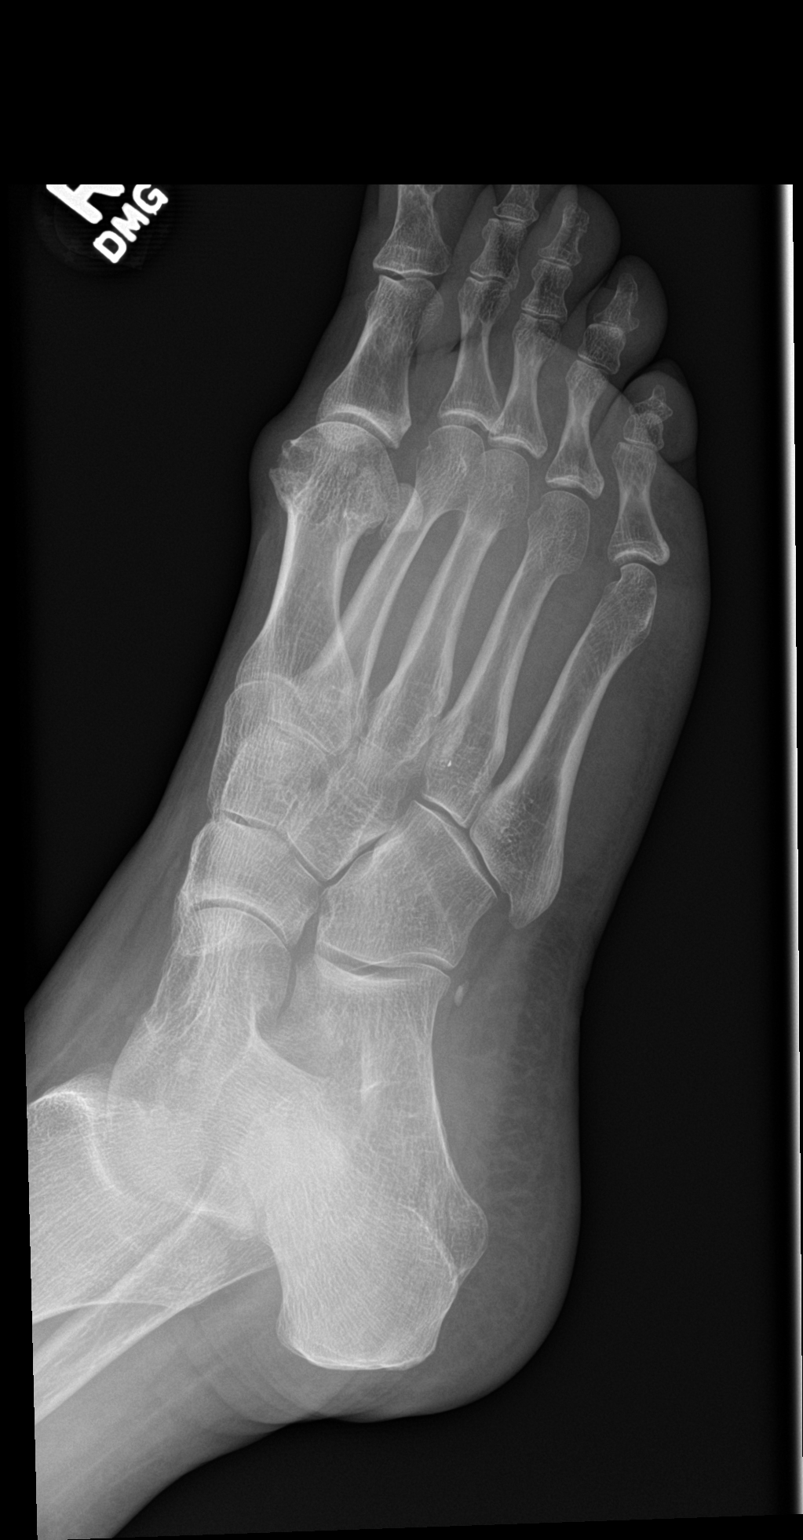

[foot lat]
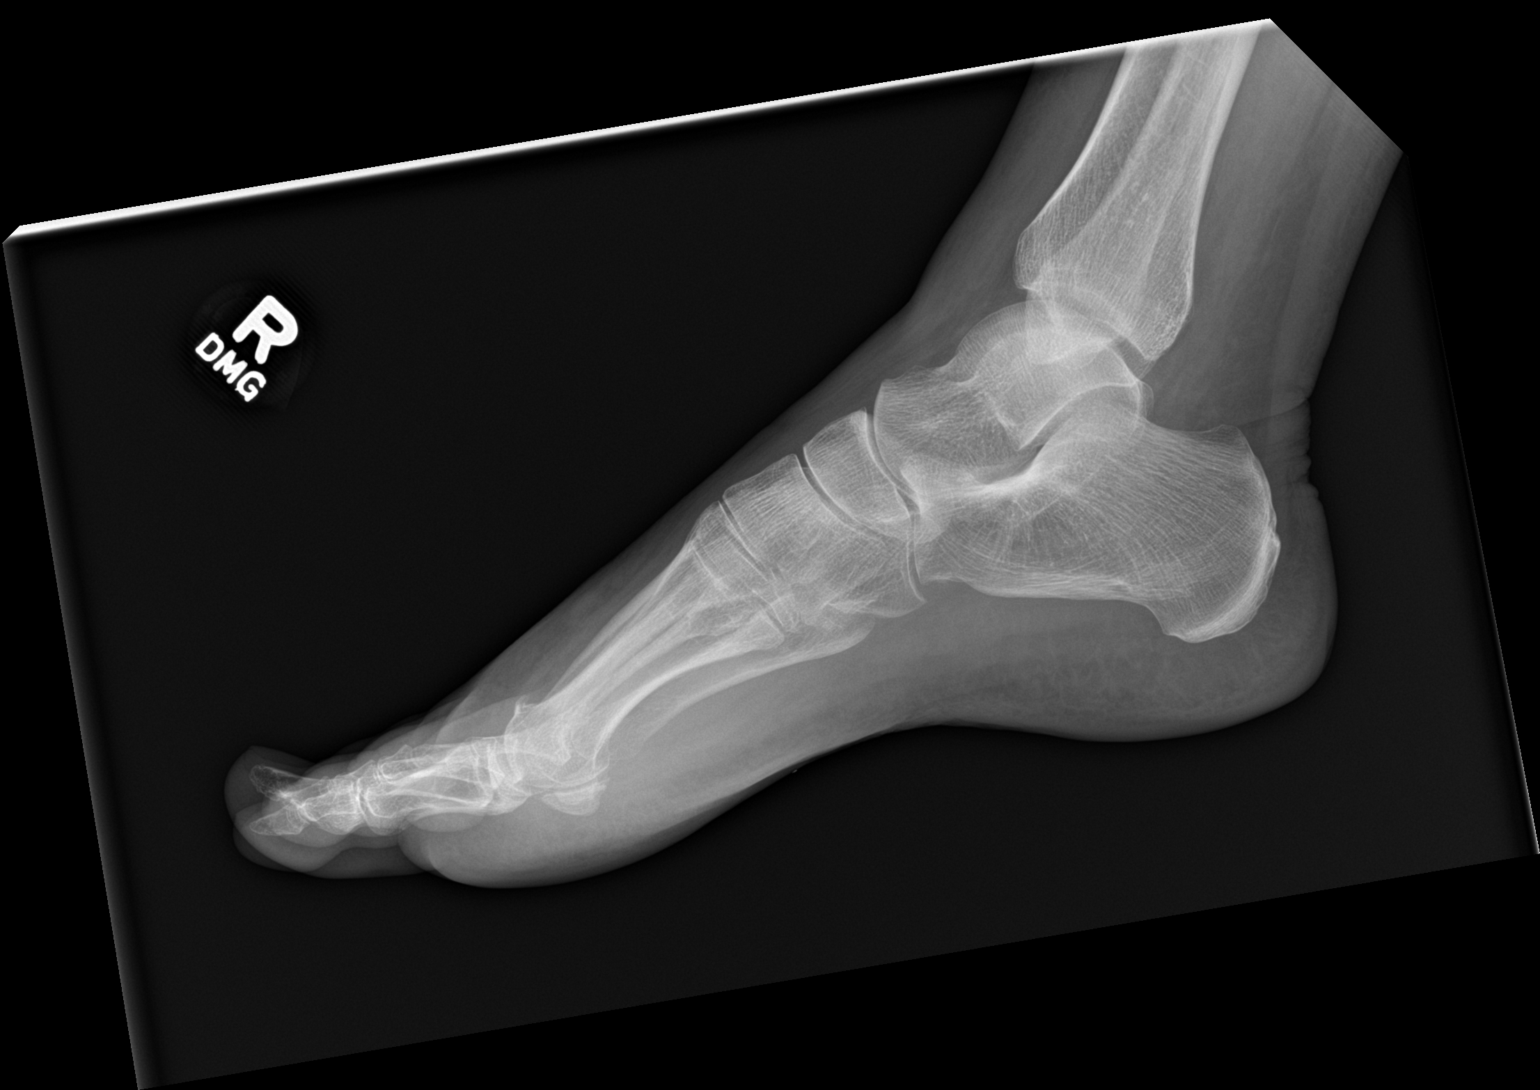

[3 of 3 positions shown; findings below may reference images not displayed]

FINDINGS: No acute fracture or subluxation. Note is made of hallux valgus
deformity. No radiopaque foreign body or soft tissue gas.
IMPRESSION: No evidence for acute  abnormality.
